# Patient Record
Sex: Female | Born: 1960 | Race: White | Hispanic: No | Smoking: Never smoker
Health system: Southern US, Community
[De-identification: ages and names within clinical notes are randomized; demographics above are authoritative.]

---

## 2013-12-07 ENCOUNTER — Encounter (HOSPITAL_COMMUNITY): Payer: Self-pay | Admitting: Emergency Medicine

## 2013-12-07 ENCOUNTER — Emergency Department (HOSPITAL_COMMUNITY)
Admission: EM | Admit: 2013-12-07 | Discharge: 2013-12-08 | Disposition: A | Discharge status: Home / Self Care | Payer: Managed Care, Other (non HMO) | Attending: Emergency Medicine | Admitting: Emergency Medicine

## 2013-12-07 ENCOUNTER — Emergency Department (HOSPITAL_COMMUNITY): Payer: Managed Care, Other (non HMO)

## 2013-12-07 DIAGNOSIS — Y9389 Activity, other specified: Secondary | ICD-10-CM | POA: Insufficient documentation

## 2013-12-07 DIAGNOSIS — H5500 Unspecified nystagmus: Secondary | ICD-10-CM | POA: Insufficient documentation

## 2013-12-07 DIAGNOSIS — Z043 Encounter for examination and observation following other accident: Secondary | ICD-10-CM | POA: Diagnosis not present

## 2013-12-07 DIAGNOSIS — F1010 Alcohol abuse, uncomplicated: Secondary | ICD-10-CM | POA: Diagnosis not present

## 2013-12-07 DIAGNOSIS — Y92009 Unspecified place in unspecified non-institutional (private) residence as the place of occurrence of the external cause: Secondary | ICD-10-CM | POA: Insufficient documentation

## 2013-12-07 DIAGNOSIS — Z79899 Other long term (current) drug therapy: Secondary | ICD-10-CM | POA: Insufficient documentation

## 2013-12-07 DIAGNOSIS — F10929 Alcohol use, unspecified with intoxication, unspecified: Secondary | ICD-10-CM

## 2013-12-07 DIAGNOSIS — W19XXXA Unspecified fall, initial encounter: Secondary | ICD-10-CM

## 2013-12-07 DIAGNOSIS — R296 Repeated falls: Secondary | ICD-10-CM | POA: Diagnosis not present

## 2013-12-07 LAB — CBC
HCT: 39.8 % (ref 36.0–46.0)
Hemoglobin: 13.9 g/dL (ref 12.0–15.0)
MCH: 30 pg (ref 26.0–34.0)
MCHC: 34.9 g/dL (ref 30.0–36.0)
MCV: 86 fL (ref 78.0–100.0)
PLATELETS: 263 10*3/uL (ref 150–400)
RBC: 4.63 MIL/uL (ref 3.87–5.11)
RDW: 13.5 % (ref 11.5–15.5)
WBC: 6.1 10*3/uL (ref 4.0–10.5)

## 2013-12-07 LAB — BASIC METABOLIC PANEL
Anion gap: 14 (ref 5–15)
BUN: 12 mg/dL (ref 6–23)
CO2: 23 mEq/L (ref 19–32)
Calcium: 8.8 mg/dL (ref 8.4–10.5)
Chloride: 110 mEq/L (ref 96–112)
Creatinine, Ser: 0.8 mg/dL (ref 0.50–1.10)
GFR, EST NON AFRICAN AMERICAN: 84 mL/min — AB (ref >90)
Glucose, Bld: 107 mg/dL — ABNORMAL HIGH (ref 70–99)
POTASSIUM: 4.5 meq/L (ref 3.7–5.3)
SODIUM: 147 meq/L (ref 137–147)

## 2013-12-07 LAB — ETHANOL: Alcohol, Ethyl (B): 247 mg/dL — ABNORMAL HIGH (ref 0–11)
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
## 2013-12-07 NOTE — ED Notes (Signed)
Patient transported to CT 

## 2013-12-07 NOTE — ED Notes (Signed)
Daughter Lady GaryKatrina is ride home, cell [phone]
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
## 2013-12-08 LAB — RAPID URINE DRUG SCREEN, HOSP PERFORMED
Amphetamines: NOT DETECTED
BENZODIAZEPINES: NOT DETECTED
Barbiturates: NOT DETECTED
Cocaine: NOT DETECTED
OPIATES: NOT DETECTED
Tetrahydrocannabinol: NOT DETECTED

## 2013-12-08 NOTE — ED Notes (Signed)
Pt A&OX4, ambulatory at d/c with steady gait, NAD 

## 2013-12-08 NOTE — ED Provider Notes (Signed)
Medical screening examination/treatment/procedure(s) were conducted as a shared visit with non-physician practitioner(s) and myself.  I personally evaluated the patient during the encounter.   EKG Interpretation None      I interviewed and examined the patient. Lungs are CTAB. Cardiac exam wnl. Abdomen soft.  No obvious traumatic injury on exam. Imaging non-contrib. Pt otherwise well appearing and interactive. Will d/c home to daughters care.   Purvis SheffieldForrest Celestial Barnfield, MD 12/08/13 1224

## 2013-12-08 NOTE — Discharge Instructions (Signed)
Call for a follow up appointment with a Family or Primary Care Provider.  Return if Symptoms worsen.   Take medication as prescribed.  Avoid misuse of alcohol.   Emergency Department Resource Guide 1) Find a Doctor and Pay Out of Pocket Although you won't have to find out who is covered by your insurance plan, it is a good idea to ask around and get recommendations. You will then need to call the office and see if the doctor you have chosen will accept you as a new patient and what types of options they offer for patients who are self-pay. Some doctors offer discounts or will set up payment plans for their patients who do not have insurance, but you will need to ask so you aren't surprised when you get to your appointment.  2) Contact Your Local Health Department Not all health departments have doctors that can see patients for sick visits, but many do, so it is worth a call to see if yours does. If you don't know where your local health department is, you can check in your phone book. The CDC also has a tool to help you locate your state's health department, and many state websites also have listings of all of their local health departments.  3) Find a Walk-in Clinic If your illness is not likely to be very severe or complicated, you may want to try a walk in clinic. These are popping up all over the country in pharmacies, drugstores, and shopping centers. They're usually staffed by nurse practitioners or physician assistants that have been trained to treat common illnesses and complaints. They're usually fairly quick and inexpensive. However, if you have serious medical issues or chronic medical problems, these are probably not your best option.  No Primary Care Doctor: - Call Health Connect at  [phone] - they can help you locate a primary care doctor that  accepts your insurance, provides certain services, etc. - Physician Referral Service- [phone]  Chronic Pain  Problems: Organization         Address  Phone   Notes  [facility]  [phone] Patients need to be referred by their primary care doctor.   Medication Assistance: Organization         Address  Phone   Notes  [facility] [address] [place], [place] [address] [phone]) [phone] --Must be a resident of [place] -- Must have NO insurance coverage whatsoever (no Medicaid/ Medicare, etc.) -- The pt. MUST have a primary care doctor that directs their care regularly and follows them in the community   Med[facility]ist  [phone]   [employer]  [phone]    Agencies that provide inexpensive medical care: Organization         Address  Phone   Notes  [facility]  [phone]   [facility]    [phone]   [facility] [address] [place], [place] [address] [phone]   [facility] [address], [place] [phone]   [facility]    [phone]   [facility]    [phone]   [facility] and [facility]  [address],  Phone:  [phone]) [phone], Fax:  [phone] Hours of Operation:  9 am - 6 pm, M-F.  Also accepts Medicaid/Medicare and self-pay.  [facility]  [address]  [place], [place], [place] Phone: [phone], Fax: [phone]. Hours of Operation:  8:30 am - 5:30 pm, M-F.  Also accepts Medicaid and self-pay.  [facility] [place] [address], [place] Phone: [phone]   [place], [place], [place] [phone], Ext. [phone] Mondays & Thursdays: 7-9 AM.  First 15 patients are seen on a first come, first serve basis.    [place] Providers:  Organization         Address  Phone   Notes  [facility] [address], [place] [phone] Also  accepts self-pay patients.  [facility] [address], [place]  [phone]   [place], [address], [place] [phone]   [facility] [address] [place] [phone]   Lucianne Lei [address], [place]   [phone] Only accepts [place] Access [place] patients after they have their name applied to their card.   Self-Pay (no insurance) in [facility]:  Organization         Address  Phone   Notes  Sickle Cell Patients, [facility] [place] [phone]   [facility] [place] [phone]   [facility] [place]  [place], [place],  [phone]   [facility]/Dr. Osei-Bonsu  [address] [place] or [address], [place] [phone] Phone number for both [place] and [place] locations is the same.  Urgent [facility] [address], [place] [phone]   [facility] [address] [place] or [address] [phone]   [facility] [address], [place] [phone], phone; [phone], fax Sees patients 1st and 3rd Saturday of every month.  Must not qualify for public or private insurance (i.e. Medicaid, Medicare, [place] Health Choice, Veterans' Benefits)  Household income should be no more than 200% of the poverty level The clinic cannot treat you if you are pregnant or think you are pregnant  Sexually transmitted diseases are not treated at the clinic.    Dental Care: Organization         Address  Phone  Notes  [facility] [place] [phone] Accepts children up to age 34 who are enrolled in [place] or [place]; pregnant  women with a Medicaid card; and children who have applied for Medicaid or [place] Health Choice, but were declined, whose parents can pay a reduced fee at time of service.  [facility]  [address], [place] [phone] Accepts children up to age 24 who are enrolled in [place] or [place]; pregnant women with a Medicaid card; and children who have applied for Medicaid or [place] Health Choice, but were declined, whose parents can pay a reduced fee at time of service.  [facility]  [place] [phone] Patients are seen by appointment only. Walk-ins are not accepted. [place] will see patients 57 years of age and older. Monday - Tuesday (8am-5pm) Most Wednesdays (8:30-5pm) $30 per visit, cash only  [place]  [address], [place] [phone] Patients are seen by appointment only. Walk-ins are not accepted. [facility] will see patients 18 years of age and older. °One Wednesday Evening (Monthly: Volunteer Based).  $30 per visit, cash only  °[facility]  [phone] for adults; Children under age 4, call Graduate Pediatric Dentistry at [phone]. Children aged 4-14, please call [phone] to request a pediatric application. ° Dental services are provided in all areas of dental care including fillings, crowns and bridges, complete and partial dentures, implants, gum treatment, root canals, and extractions. Preventive care is also provided. Treatment is provided to both adults and children. °Patients are selected via a lottery and there is often a waiting list. °  °[facility] [address], °[place] ° [phone] [email or web address] °  °[facility] [address], [place], [place] [phone], Ext. [phone] Second and Fourth Thursday of each month, opens at 6:30 AM; Clinic ends at 9 AM.  Patients are  seen on a first-come first-served basis, and a limited number are seen during each clinic.  ° °[facility] ° [address], [place], [place] [phone]   Eligibility Requirements °You must have lived in [place], [place], or [place] counties for at least the last three months. °  You cannot be eligible for state or federal sponsored healthcare insurance, including Veterans Administration, Medicaid, or Medicare. °  You generally cannot be eligible for healthcare insurance through your employer.  °  How to apply: °Eligibility screenings are held every Tuesday and Wednesday afternoon from 1:00 pm until 4:00 pm. You do not need an appointment for the interview!  °[facility] [address], [place], [place] [phone]   °[place]  [phone]   °[place]  [phone]   °[facility]  [phone]   ° °Behavioral Health Resources in the Community: °Intensive Outpatient Programs °Organization         Address  Phone  Notes  °[facility] [address], [place], [place] [phone]   °[facility] [address], [place], [place] [phone]   °[facility] [address], [place], [place] ° 336-882-2125   °[facility] [address],  °[place], [place] 1-800-853-5163 or 336-641-4981   °Substance Abuse Resources °Organization         Address  Phone  Notes  °[facility]  336-882-2125   °[facility]  336-784-9470   °[facility]  336-285-9073   °[facility]  336-845-3988   °Residential & Outpatient Substance Abuse Program  1-800-659-3381   °Psychological Services °Organization         Address  Phone  Notes  ° Health  336- 832-9600   °[facility]  336- 378-7881   °[facility] [address], [place] 1-800-853-5163 or 336-641-4981   ° °Mobile Crisis  Teams °Organization         Address  Phone  Notes  °[facility], Mobile Crisis Care Unit  1-877-626-1772   °Assertive °Psychotherapeutic Services ° [address] [place], [place] 336-834-9664   °Sharon DeEsch [address] °[place] 336-554-5454   ° °Self-Help/Support Groups °Organization         Address  Phone             Notes  °[facility] [place] -   variety of support groups  336- (248) 710-8350 Call for more information  Narcotics Anonymous (NA), [facility] [address], [place]  2 meetings at this location   Residential Sports administratorTreatment Programs Organization         Address  Phone  Notes  [facility] [address],    [place]  [phone]   [facility]  [address], [place] [phone], [place], [place] [phone]   [facility] [address], [place] [phone] Admissions: 8am-3pm M-F  [facility] [address]    [place], [place] [phone]   [facility] [address], [place], [place] [phone]   [facility] [address]  [place], [place] [phone]   [facility] - Intensive Outpatient [address], [place], [place] [phone]   [facility] ([facility] [address]  [place], [place] [phone] or [phone]   [facility] ([facility]) [address] [place], [place] [phone] Accepts Medicaid  [facility] [address]  [place] [phone] Substance Abuse/Addiction Treatment   [facility] Resources Organization         Address  Phone  Notes  [facility]  [phone]) [phone]   Angie FavaJulie Brannon, PhD [address], [address] [place], [place]   [phone]) [phone] or [phone](336) [phone]   [facility]   [address] [place], [place] [phone]) [phone]   [facility] [address], [place], [place] [phone](336) [phone]  Insurance/Medicaid/sponsorship through [facility]  [facility] [address]                                    [place], [place] [phone](336) [phone] Therapy/tele-psych/case  [facility] [address].   [place], [place] [phone](336) [phone]    Dr. Lolly MustacheArfeen  [phone](336) [phone]   [facility] [place]  [employer] [facility] 1) [address],  2) [address], [place] 3)  [address], [place] [phone](336) [phone]  [phone]([phone]   [facility] Child Abuse Hotline [phone](336) [phone] or [phone](336) [phone] (After Hours)

## 2013-12-08 NOTE — ED Notes (Signed)
The tech gave the patient's jewelry (a gold necklace-heart shaped with a [place] building) and two diamond studs (ear rings), to her eldest daughter Sydney Gary(Katrina). The tech has reported to the RN in charge.

## 2015-07-28 IMAGING — CT CT HEAD W/O CM
4 of 6 series · 20 of 47 positions shown, 22 images · non-contrast
Comparison: None.

CLINICAL DATA: Alcohol intoxication

EXAM:
CT HEAD WITHOUT CONTRAST
CT CERVICAL SPINE WITHOUT CONTRAST
TECHNIQUE: Multidetector CT imaging of the head and cervical spine was
performed following the standard protocol without intravenous
contrast. Multiplanar CT image reconstructions of the cervical spine
were also generated.

[Series 302: soft tissue, idose (2) · axial · 0.39mm/px · z∈[-10,+130]mm · 8 of 92 slices shown, 10 images]
[im 11/92  brain]
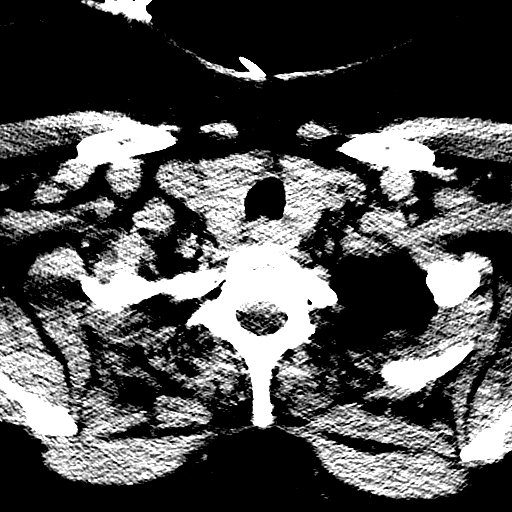
[im 11/92  bone]
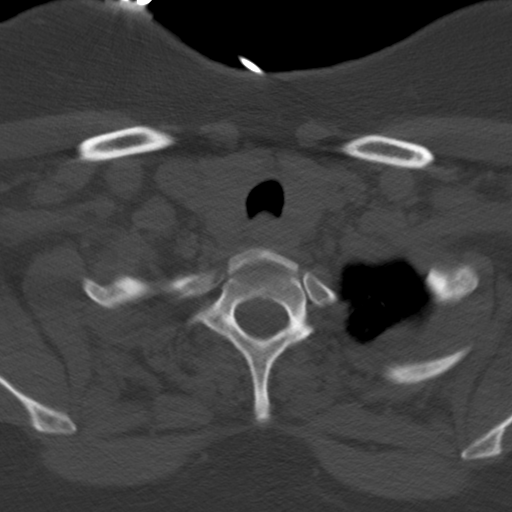
[im 21/92  brain]
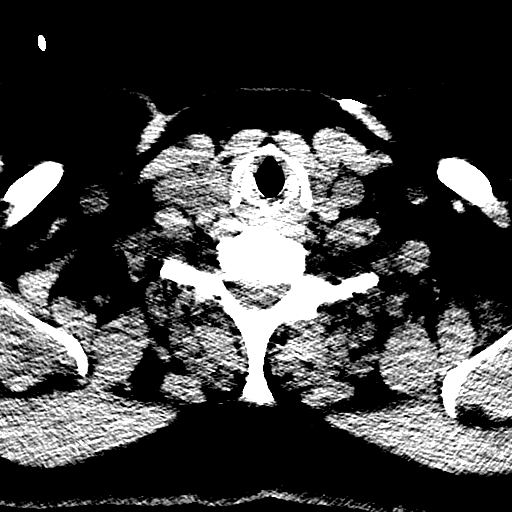
[im 31/92  brain]
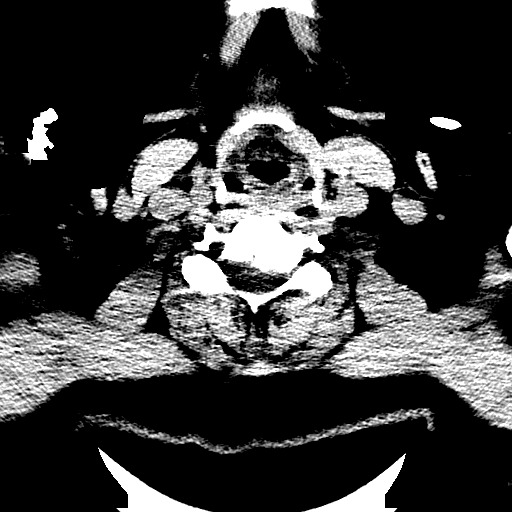
[im 41/92  brain]
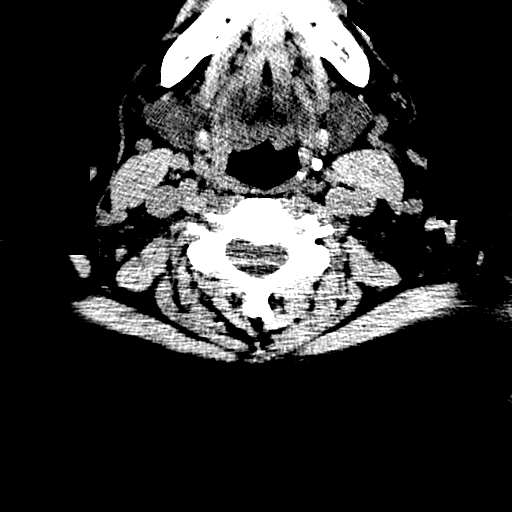
[im 51/92  brain]
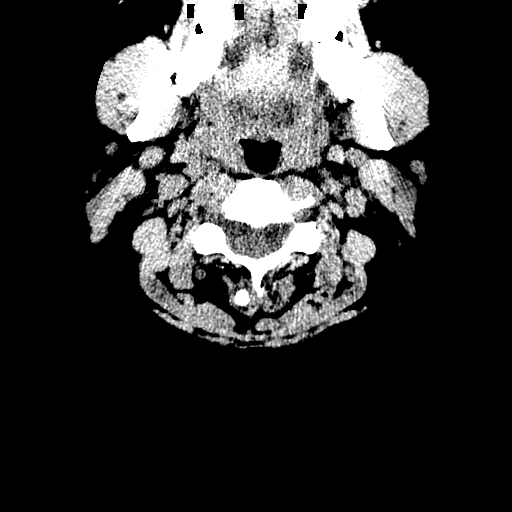
[im 51/92  bone]
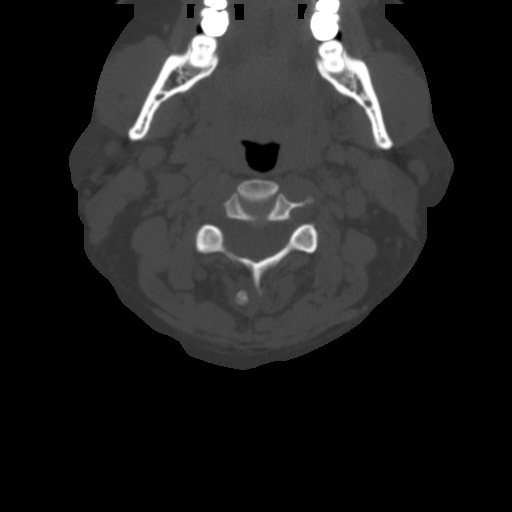
[im 61/92  brain]
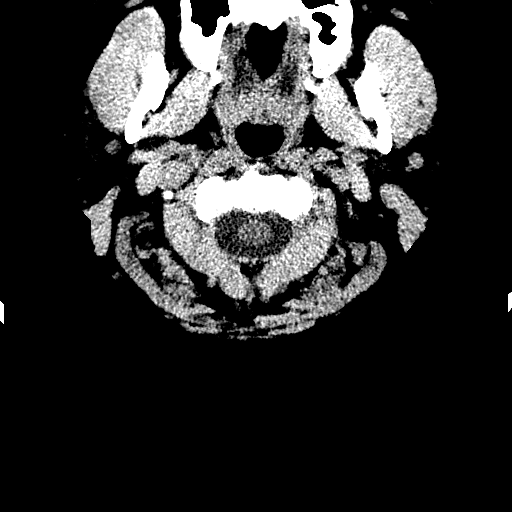
[im 71/92  brain]
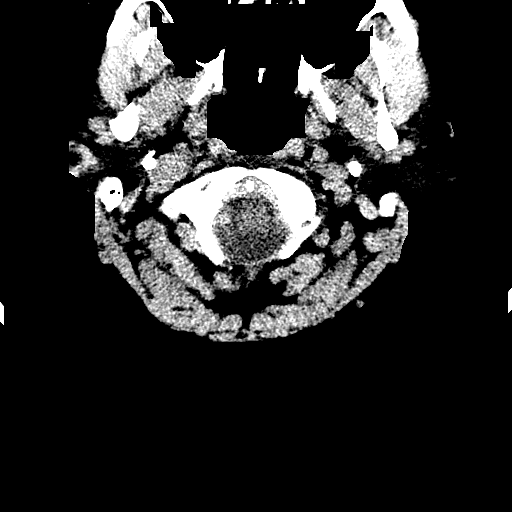
[im 81/92  brain]
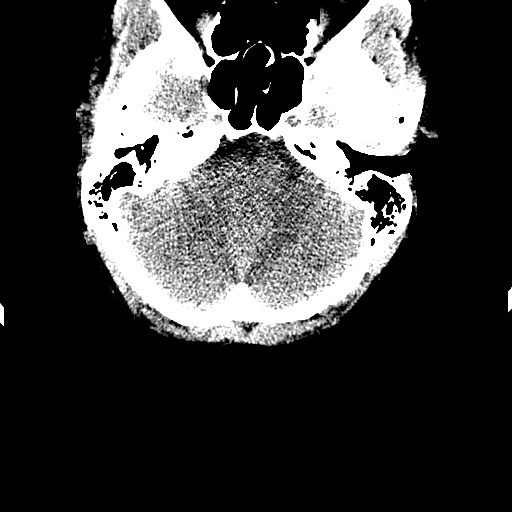

[Series 303: sagittal, idose (2) · sagittal · 0.34mm/px · 3 of 79 slices shown]
[im 27/79  brain]
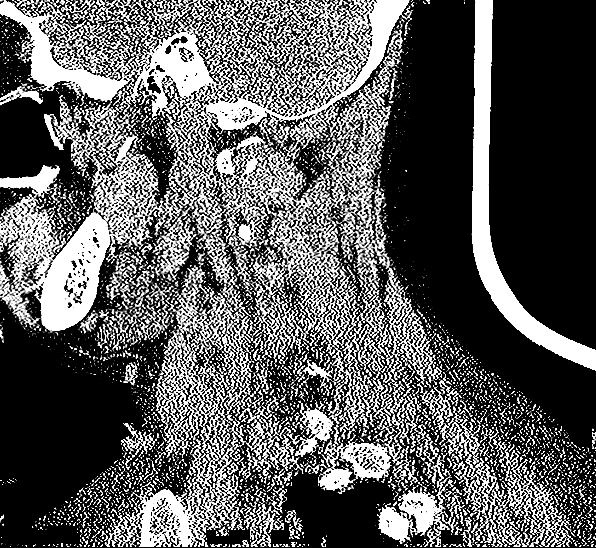
[im 40/79  brain]
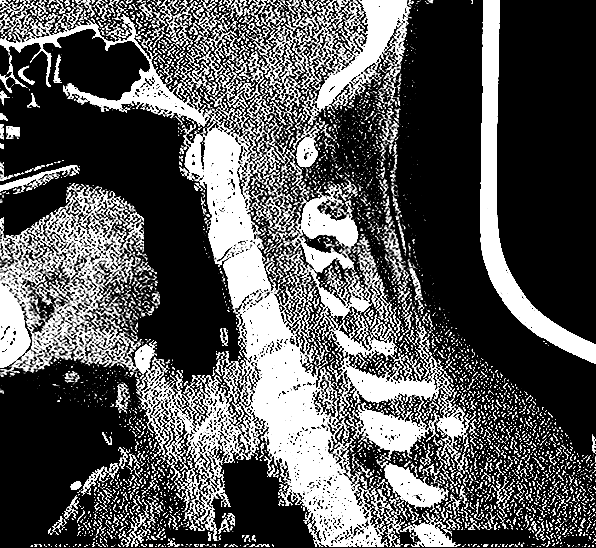
[im 53/79  brain]
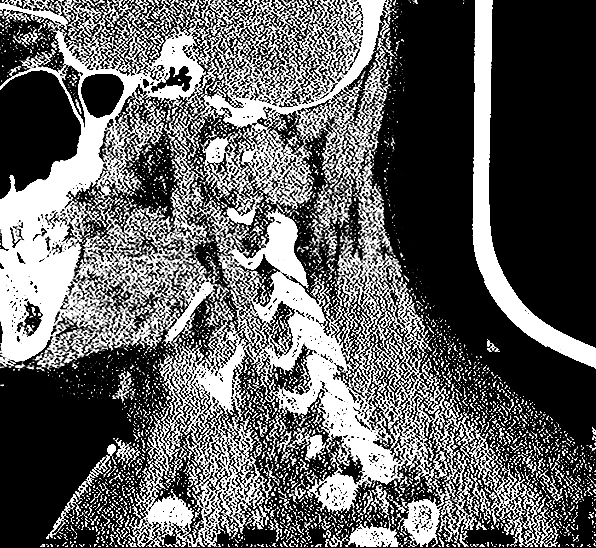

[Series 304: coronal, idose (2) · coronal · 0.36mm/px · 3 of 77 slices shown]
[im 26/77  brain]
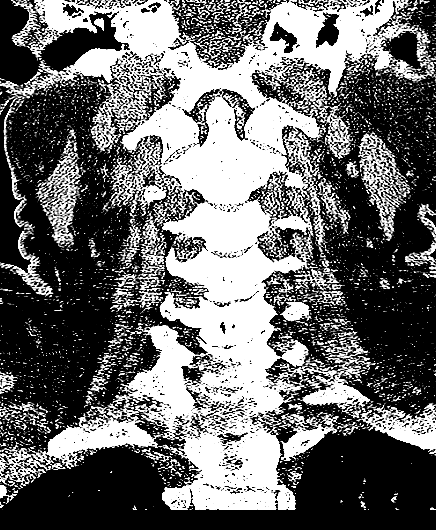
[im 34/77  brain]
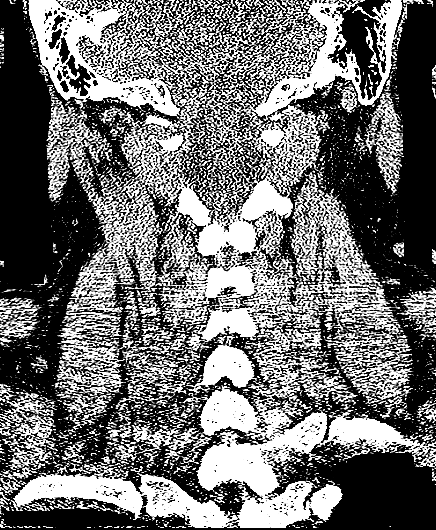
[im 43/77  brain]
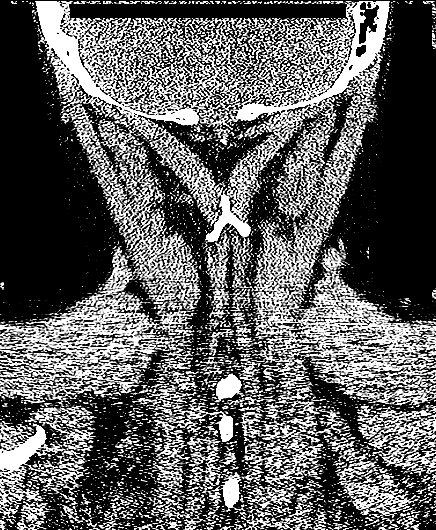

[Series 305: orthogonals, idose (2) · axial · 0.41mm/px · z∈[-33,+67]mm · 6 of 96 slices shown]
[im 11/96  brain]
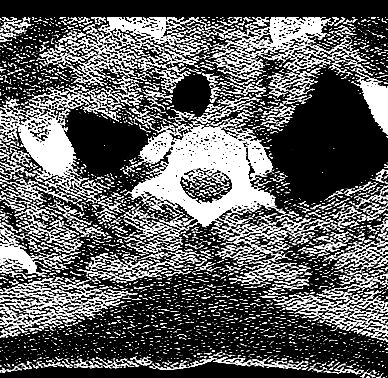
[im 22/96  brain]
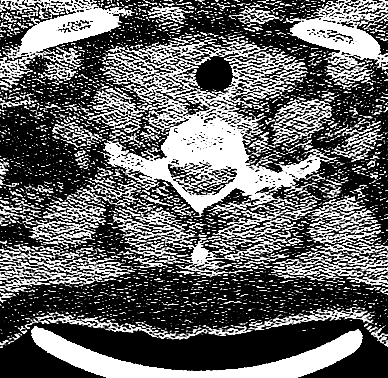
[im 32/96  brain]
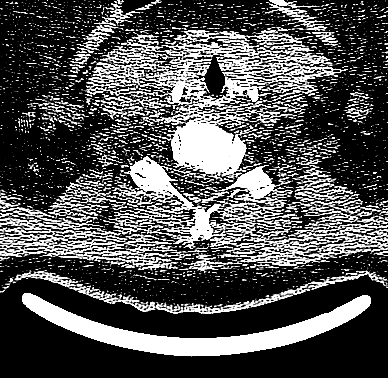
[im 43/96  brain]
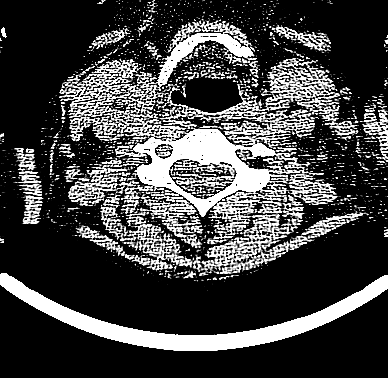
[im 53/96  brain]
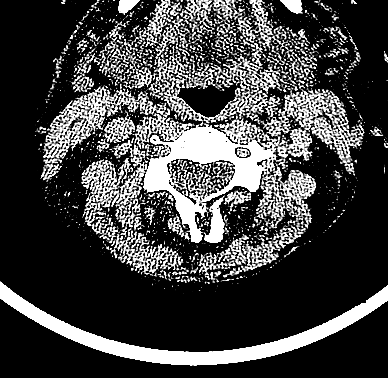
[im 64/96  brain]
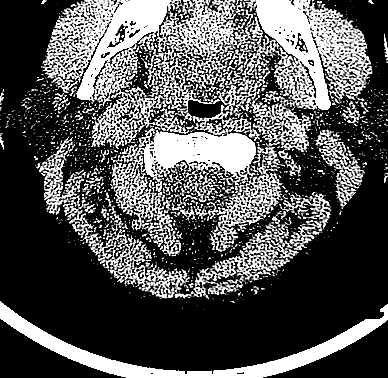

[20 of 47 positions shown; findings below may reference images not displayed]

FINDINGS: CT HEAD FINDINGS

No mass lesion. No midline shift. No acute hemorrhage or hematoma.
No extra-axial fluid collections. No evidence of acute infarction.
Calvarium is intact.

CT CERVICAL SPINE FINDINGS

Diffuse cervical adenopathy bilaterally. Tiny calcified granuloma
right lung apex. Normal cervical spine alignment with no
prevertebral soft tissue swelling. No fracture. Moderate C5-6 and
C6-7 degenerative disc disease.
IMPRESSION: Normal head CT. No acute abnormalities involving the cervical spine.
